# Patient Record
Sex: Male | Born: 1995 | Race: Asian | Hispanic: No
Health system: Southern US, Community
[De-identification: ages and names within clinical notes are randomized; demographics above are authoritative.]

---
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
## 2019-12-30 ENCOUNTER — Ambulatory Visit: Payer: Self-pay | Attending: Family

## 2019-12-30 DIAGNOSIS — Z23 Encounter for immunization: Secondary | ICD-10-CM

## 2019-12-30 NOTE — Progress Notes (Signed)
   Cov[facility] Vaccination [facility]  Name:  [name]    MRN: [id number] DOB: 06/08/1996  12/30/2019  Mr. Bellamy was observed post Covid-19 immunization for 15 minutes without incident. He was provided with Vaccine Information Sheet and instruction to access the V-Safe system.   Mr. Keena was instructed to call 911 with any severe reactions post vaccine: Marland Kitchen Difficulty breathing  . Swelling of face and throat  . A fast heartbeat  . A bad rash all over body  . Dizziness and weakness   Immunizations Administered    Name Date Dose VIS Date Route   Moderna COVID-19 Vaccine 12/30/2019  2:55 PM 0.5 mL 09/21/2019 Intramuscular   Manufacturer: Moderna   Lot: [id number]   NDC: 34742-595-63

## 2020-02-01 ENCOUNTER — Ambulatory Visit: Payer: Self-pay | Attending: Family

## 2020-02-01 ENCOUNTER — Other Ambulatory Visit: Payer: Self-pay

## 2020-02-01 DIAGNOSIS — Z23 Encounter for immunization: Secondary | ICD-10-CM

## 2020-02-01 NOTE — Progress Notes (Signed)
   Covid-19 Vaccination Clinic  Name:  Lance Webb    MRN: [id number] DOB: 03-01-1996  02/01/2020  [name] was observed post Covid-19 immunization for 15 minutes without incident. He was provided with Vaccine Information Sheet and instruction to access the V-Safe system.   [name] was instructed to call 911 with any severe reactions post vaccine: Marland Kitchen Difficulty breathing  . Swelling of face and throat  . A fast heartbeat  . A bad rash all over body  . Dizziness and weakness   Immunizations Administered    Name Date Dose VIS Date Route   Moderna COVID-19 Vaccine 02/01/2020  2:03 PM 0.5 mL 09/21/2019 Intramuscular   Manufacturer: Moderna   Lot: [id number]   NDC: 92957-473-40
